# Patient Record
Sex: Female | Born: 1950 | Race: White | Hispanic: No | Marital: Married | Smoking: Never smoker
Health system: Southern US, Academic
[De-identification: ages and names within clinical notes are randomized; demographics above are authoritative.]

## PROBLEM LIST (undated history)

## (undated) DIAGNOSIS — G4730 Sleep apnea, unspecified: Secondary | ICD-10-CM

## (undated) DIAGNOSIS — I4891 Unspecified atrial fibrillation: Secondary | ICD-10-CM

## (undated) DIAGNOSIS — K219 Gastro-esophageal reflux disease without esophagitis: Secondary | ICD-10-CM

## (undated) HISTORY — DX: Unspecified atrial fibrillation (CMS HCC): I48.91

## (undated) HISTORY — PX: ESOPHAGOGASTRODUODENOSCOPY: SHX1529

## (undated) HISTORY — DX: Gastro-esophageal reflux disease without esophagitis: K21.9

## (undated) HISTORY — DX: Sleep apnea, unspecified: G47.30

## (undated) HISTORY — PX: SKIN SURGERY: SHX2413

---

## 2006-11-30 DIAGNOSIS — M1990 Unspecified osteoarthritis, unspecified site: Secondary | ICD-10-CM | POA: Insufficient documentation

## 2007-03-31 DIAGNOSIS — K219 Gastro-esophageal reflux disease without esophagitis: Secondary | ICD-10-CM | POA: Insufficient documentation

## 2008-11-21 DIAGNOSIS — K5730 Diverticulosis of large intestine without perforation or abscess without bleeding: Secondary | ICD-10-CM | POA: Insufficient documentation

## 2009-08-15 DIAGNOSIS — Z0000 Encounter for general adult medical examination without abnormal findings: Secondary | ICD-10-CM | POA: Insufficient documentation

## 2013-11-30 DIAGNOSIS — E785 Hyperlipidemia, unspecified: Secondary | ICD-10-CM | POA: Insufficient documentation

## 2014-06-28 ENCOUNTER — Other Ambulatory Visit (HOSPITAL_COMMUNITY): Payer: Self-pay | Admitting: Surgery

## 2014-07-26 DIAGNOSIS — C4362 Malignant melanoma of left upper limb, including shoulder: Secondary | ICD-10-CM | POA: Insufficient documentation

## 2016-09-22 DIAGNOSIS — I4891 Unspecified atrial fibrillation: Secondary | ICD-10-CM | POA: Insufficient documentation

## 2017-12-18 DIAGNOSIS — E663 Overweight: Secondary | ICD-10-CM | POA: Insufficient documentation

## 2018-07-16 DIAGNOSIS — I959 Hypotension, unspecified: Secondary | ICD-10-CM | POA: Insufficient documentation

## 2018-07-16 DIAGNOSIS — G4739 Other sleep apnea: Secondary | ICD-10-CM | POA: Insufficient documentation

## 2021-07-07 ENCOUNTER — Ambulatory Visit (INDEPENDENT_AMBULATORY_CARE_PROVIDER_SITE_OTHER): Payer: Medicare Other | Admitting: Family

## 2021-07-07 ENCOUNTER — Encounter (INDEPENDENT_AMBULATORY_CARE_PROVIDER_SITE_OTHER): Payer: Self-pay | Admitting: Family

## 2021-07-07 ENCOUNTER — Other Ambulatory Visit: Payer: Self-pay

## 2021-07-07 VITALS — BP 120/67 | HR 67 | Resp 16 | Ht 61.0 in | Wt 148.2 lb

## 2021-07-07 DIAGNOSIS — R3129 Other microscopic hematuria: Secondary | ICD-10-CM

## 2021-07-07 DIAGNOSIS — R319 Hematuria, unspecified: Secondary | ICD-10-CM | POA: Insufficient documentation

## 2021-07-07 LAB — POCT URINE DIPSTICK
BILIRUBIN: NEGATIVE
BLOOD: NEGATIVE
GLUCOSE: NEGATIVE
KETONE: NEGATIVE
LEUKOCYTES: NEGATIVE
NITRITE: NEGATIVE
PH: 7
PROTEIN: NEGATIVE
SPECIFIC GRAVITY: 1.01
UROBILINOGEN: 0.2

## 2021-07-07 NOTE — Progress Notes (Signed)
UROLOGY, [facility]  [address]  [place] [address]      History and Physical Notes     Name: Lindsay Flores MRN:  [id number]   Date: 07/07/2021 Age/DOB:71 y.o. Jan 18, 1951        Name: Lindsay Flores                       Date of Birth: 08/28/1950   MRN:  [id number]                         Date of visit: 07/07/2021     PCP: Hipolito Bayley, FNP   Referring Provider: No referring provider defined for this encounter.     HPI:  Lindsay Flores is a 71 y.o. female who presents with Blood in urine (New patient ref for hematuria. Patient states she does not visually see blood.  Patient states she set down on the bed and felt like something had pushed up in her. She states the only thing different she did was added more green tea to her diet.)   to clinic.      UA today negative. She is on Eliquis and ASA. She doesn't smoke and has no family history of bladder cancers.    Past Medical History  Current Outpatient Medications   Medication Sig   . apixaban (ELIQUIS) 5 mg Oral Tablet Take 1 Tablet (5 mg total) by mouth Twice daily   . aspirin (ECOTRIN) 81 mg Oral Tablet, Delayed Release (E.C.) Take 1 Tablet (81 mg total) by mouth Once a day   . calcium carbonate-vitamin D3 500 mg-5 mcg (200 unit) Oral Tablet Take 1 Tablet by mouth Once a day   . coenzyme Q10 30 mg Oral Capsule Take 1 Capsule (30 mg total) by mouth Every 7 days Take twice a week   . famotidine (PEPCID) 40 mg Oral Tablet Take 1 Tablet (40 mg total) by mouth Once a day   . Magnesium 250 mg Oral Tablet Take 1 Tablet (250 mg total) by mouth Once a day   . multivitamin with minerals (DAILY MULTIVITAMIN-MINERALS) Oral Tablet Take 1 Tablet by mouth Once a day   . pantoprazole (PROTONIX) 40 mg Oral Tablet, Delayed Release (E.C.) Take 1 Tablet (40 mg total) by mouth Once a day   . rosuvastatin (CRESTOR) 20 mg Oral Tablet Take 1 Tablet (20 mg total) by mouth Once a day     No Known Allergies  Past Medical History:   Diagnosis Date   . A-fib (CMS  HCC)    . Esophageal reflux    . Sleep apnea          Past Surgical History:   Procedure Laterality Date   . ESOPHAGOGASTRODUODENOSCOPY     . HX CESAREAN SECTION      x2   . 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 Marital status: Married   Tobacco Use   . Smoking status: Never   . Smokeless tobacco: Never   Substance and Sexual Activity   . Alcohol use: Never   . Drug use: Never     Social  Determinants of Health     Financial Resource Strain: Low Risk    . SDOH Financial: No   Transportation Needs: Low Risk    . SDOH Transportation: No   Social Connections: Low Risk    . SDOH Social Isolation: 5 or more times a week   Intimate Partner Violence: Low Risk    . SDOH Domestic Violence: No   Housing Stability: Low Risk    . SDOH Housing Situation: I have housing.        Patient Active Problem List    Diagnosis Date Noted   . Hematuria 07/07/2021        REVIEW OF SYSTEMS:   As per HPI.     Physical Exam  Constitutional:       General: She is not in acute distress.     Appearance: Normal appearance.   Pulmonary:      Effort: Pulmonary effort is normal. No respiratory distress.   Neurological:      Mental Status: She is alert.   Psychiatric:         Mood and Affect: Mood normal.         Behavior: Behavior normal.       BP 120/67   Pulse 67   Resp 16   Ht 1.549 m ('5\' 1"'$ )   Wt 67.2 kg (148 lb 3.2 oz)   SpO2 98%   BMI 28.00 kg/m         Assessment/Plan  Assessment/Plan   1. Other microscopic hematuria      Orders Placed This Encounter   . POCT Urine Dipstick        Renal U/S and follow-up after. She would like this done at the end of June.    Vora Clover, FNP-C

## 2021-09-10 ENCOUNTER — Other Ambulatory Visit: Payer: Self-pay

## 2021-09-10 ENCOUNTER — Inpatient Hospital Stay
Admission: RE | Admit: 2021-09-10 | Discharge: 2021-09-10 | Disposition: A | Discharge status: Home / Self Care | Payer: Medicare Other | Source: Ambulatory Visit | Attending: Family | Admitting: Family

## 2021-09-10 DIAGNOSIS — R3129 Other microscopic hematuria: Secondary | ICD-10-CM | POA: Insufficient documentation

## 2021-09-16 ENCOUNTER — Other Ambulatory Visit: Payer: Self-pay

## 2021-09-16 ENCOUNTER — Other Ambulatory Visit: Payer: Medicare Other | Attending: Family | Admitting: Family

## 2021-09-16 ENCOUNTER — Ambulatory Visit (INDEPENDENT_AMBULATORY_CARE_PROVIDER_SITE_OTHER): Payer: Medicare Other | Admitting: Family

## 2021-09-16 ENCOUNTER — Encounter (INDEPENDENT_AMBULATORY_CARE_PROVIDER_SITE_OTHER): Payer: Self-pay | Admitting: Family

## 2021-09-16 VITALS — BP 108/68 | HR 66 | Resp 16 | Ht 61.0 in | Wt 145.0 lb

## 2021-09-16 DIAGNOSIS — R3129 Other microscopic hematuria: Secondary | ICD-10-CM | POA: Insufficient documentation

## 2021-09-16 LAB — POCT URINE DIPSTICK
BILIRUBIN: NEGATIVE
GLUCOSE: NEGATIVE
KETONE: NEGATIVE
LEUKOCYTES: NEGATIVE
NITRITE: NEGATIVE
PH: 6.5
PROTEIN: NEGATIVE
SPECIFIC GRAVITY: 1.01
UROBILINOGEN: 0.2

## 2021-09-16 LAB — URINALYSIS, MACROSCOPIC
BILIRUBIN: NEGATIVE mg/dL
BLOOD: 0.06 mg/dL — AB
GLUCOSE: NEGATIVE mg/dL
KETONES: NEGATIVE mg/dL
LEUKOCYTES: NEGATIVE WBCs/uL
NITRITE: NEGATIVE
PH: 6.5 (ref 5.0–9.0)
PROTEIN: NEGATIVE mg/dL
SPECIFIC GRAVITY: 1.008 (ref 1.002–1.030)
UROBILINOGEN: NORMAL mg/dL

## 2021-09-16 LAB — URINALYSIS, MICROSCOPIC
RBCS: 3 /hpf (ref <4)
WBCS: <1 /hpf (ref <6)

## 2021-09-16 NOTE — Progress Notes (Signed)
UROLOGY, [facility]  [address]  [place] [address]      Return Patient Note     Name: Lindsay Flores MRN:  [id number]   Date: 09/16/2021 Age/DOB:71 y.o. 1950/08/13        Name: Lindsay Flores                       Date of Birth: [birth year]   MRN:  [id number]                         Date of visit: 09/16/2021     PCP: Hipolito Bayley, FNP   Referring Provider: No referring provider defined for this encounter.     HPI:  [name] is a 71 y.o. female who presents with Blood in urine (Follow up U/S)   to clinic.      UA  07/07/21 in office was negative. She is on Eliquis and ASA. She doesn't smoke and has no family history of bladder cancers. Denies gross hematuria, dysuria and bothersome LUTS.    Renal U/S 09/10/21 showed no significant renal abnormality or upper tract obstruction.    UA 09/16/21 in office showed small blood.    Past Medical History  Current Outpatient Medications   Medication Sig   . apixaban (ELIQUIS) 5 mg Oral Tablet Take 1 Tablet (5 mg total) by mouth Twice daily   . aspirin (ECOTRIN) 81 mg Oral Tablet, Delayed Release (E.C.) Take 1 Tablet (81 mg total) by mouth Once a day   . calcium carbonate-vitamin D3 500 mg-5 mcg (200 unit) Oral Tablet Take 1 Tablet by mouth Once a day   . coenzyme Q10 30 mg Oral Capsule Take 1 Capsule (30 mg total) by mouth Every 7 days Take twice a week   . Magnesium 250 mg Oral Tablet Take 1 Tablet (250 mg total) by mouth Once a day   . multivitamin with minerals (DAILY MULTIVITAMIN-MINERALS) Oral Tablet Take 1 Tablet by mouth Once a day   . rosuvastatin (CRESTOR) 20 mg Oral Tablet Take 1 Tablet (20 mg total) by mouth Once a day     No Known Allergies  Past Medical History:   Diagnosis Date   . A-fib (CMS HCC)    . Esophageal reflux    . Sleep apnea          Past Surgical History:   Procedure Laterality Date   . ESOPHAGOGASTRODUODENOSCOPY     . HX CESAREAN SECTION      x2   . 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 Marital status: Married   Tobacco Use   . Smoking status: Never   . Smokeless tobacco: Never   Substance and Sexual Activity   . Alcohol use: Never   . Drug use: Never     Social Determinants of Health     Financial Resource Strain: Low Risk    . SDOH Financial: No   Transportation Needs: Low Risk    . SDOH Transportation: No   Social Connections: Low Risk    . SDOH Social Isolation: 5 or more times a week  Intimate Partner Violence: Low Risk    . SDOH Domestic Violence: No   Housing Stability: Low Risk    . SDOH Housing Situation: I have housing.        Patient Active Problem List    Diagnosis Date Noted   . Hematuria 07/07/2021        REVIEW OF SYSTEMS:   As per HPI.      Physical Exam  Constitutional:       General: She is not in acute distress.     Appearance: She is not ill-appearing.   Pulmonary:      Effort: Pulmonary effort is normal. No respiratory distress.   Neurological:      Mental Status: She is alert.   Psychiatric:         Mood and Affect: Mood normal.         Behavior: Behavior normal.       BP 108/68   Pulse 66   Resp 16   Ht 1.549 m ('5\' 1"'$ )   Wt 65.8 kg (145 lb)   SpO2 97%   BMI 27.40 kg/m         Assessment/Plan  Assessment/Plan   1. Other microscopic hematuria      Plan-  Renal U/S reviewed. Check microscopic UA today. Further recommendations to follow after UA is reviewed.    Kalyani Maeda, FNP-C

## 2021-09-17 ENCOUNTER — Encounter (INDEPENDENT_AMBULATORY_CARE_PROVIDER_SITE_OTHER): Payer: Self-pay | Admitting: Family

## 2021-09-18 ENCOUNTER — Other Ambulatory Visit (INDEPENDENT_AMBULATORY_CARE_PROVIDER_SITE_OTHER): Payer: Self-pay | Admitting: Family

## 2021-09-18 DIAGNOSIS — R3129 Other microscopic hematuria: Secondary | ICD-10-CM

## 2021-10-27 ENCOUNTER — Ambulatory Visit (HOSPITAL_COMMUNITY): Payer: Self-pay

## 2021-11-11 ENCOUNTER — Encounter (INDEPENDENT_AMBULATORY_CARE_PROVIDER_SITE_OTHER): Payer: Self-pay | Admitting: Student in an Organized Health Care Education/Training Program

## 2021-12-22 ENCOUNTER — Inpatient Hospital Stay
Admission: RE | Admit: 2021-12-22 | Discharge: 2021-12-22 | Disposition: A | Discharge status: Home / Self Care | Payer: Medicare Other | Source: Ambulatory Visit | Attending: Family | Admitting: Family

## 2021-12-22 ENCOUNTER — Other Ambulatory Visit (HOSPITAL_COMMUNITY): Payer: Medicare Other

## 2021-12-22 ENCOUNTER — Other Ambulatory Visit: Payer: Self-pay

## 2021-12-22 DIAGNOSIS — R3129 Other microscopic hematuria: Secondary | ICD-10-CM | POA: Insufficient documentation

## 2021-12-22 LAB — CREATININE WITH EGFR
CREATININE: 0.74 mg/dL (ref 0.60–1.30)
ESTIMATED GFR: 87 mL/min/{1.73_m2} (ref >59)

## 2021-12-22 MED ORDER — IOHEXOL 350 MG IODINE/ML INTRAVENOUS SOLUTION
50.0000 mL | INTRAVENOUS | Status: AC
Start: 2021-12-22 — End: 2021-12-22
  Administered 2021-12-22: 100 mL via INTRAVENOUS

## 2021-12-26 ENCOUNTER — Ambulatory Visit (INDEPENDENT_AMBULATORY_CARE_PROVIDER_SITE_OTHER): Payer: Medicare Other | Admitting: Student in an Organized Health Care Education/Training Program

## 2021-12-26 ENCOUNTER — Other Ambulatory Visit: Payer: Self-pay

## 2021-12-26 ENCOUNTER — Encounter (INDEPENDENT_AMBULATORY_CARE_PROVIDER_SITE_OTHER): Payer: Self-pay | Admitting: Student in an Organized Health Care Education/Training Program

## 2021-12-26 VITALS — BP 114/70 | HR 69 | Ht 61.0 in | Wt 144.2 lb

## 2021-12-26 DIAGNOSIS — N281 Cyst of kidney, acquired: Secondary | ICD-10-CM

## 2021-12-26 DIAGNOSIS — R3129 Other microscopic hematuria: Secondary | ICD-10-CM

## 2021-12-26 DIAGNOSIS — M461 Sacroiliitis, not elsewhere classified: Secondary | ICD-10-CM | POA: Insufficient documentation

## 2021-12-26 DIAGNOSIS — N200 Calculus of kidney: Secondary | ICD-10-CM

## 2021-12-26 DIAGNOSIS — J309 Allergic rhinitis, unspecified: Secondary | ICD-10-CM | POA: Insufficient documentation

## 2021-12-26 NOTE — Progress Notes (Signed)
[facility]    Progress Note    Name: Lindsay Flores MRN:  [id number]   Date: 12/26/2021 Age: 71 y.o.       Chief Complaint: Follow-up After Testing (Ct urogram 12/22/21 )    Subjective:   Lindsay Flores is a pleasant 89yoF with a history of microscopic hematuria who recently underwent a CTU which demonstrated bilateral cubcenitmeter reanal cysts and a right upper pole kidney stone. She dnenies history of gorss hemturia. She denies a personal or family history of urologic malignancy. She denies history of occupational chemical exposure.  She reports history kidney stones in the past. She denies fevers, chills, nausea, vomiting, hematuria, dysuria, flank pain, incontinence, dribbling, hesitancy, suprapubic pain, headaches, vision changes, shortness of breath, chest pain.        Objective :  BP 114/70 (Site: Left, Patient Position: Sitting, Cuff Size: Adult)   Pulse 69   Ht 1.549 m ('5\' 1"'$ )   Wt 65.4 kg (144 lb 3.2 oz)   SpO2 98%   BMI 27.25 kg/m       Gen: NAD, alert  Pulm: unlabored at rest  CV: palpable pulses  Abd: soft, Nt/ND  GU: no suprapubic tenderness, no CVAT    Data reviewed:    Current Outpatient Medications   Medication Sig    apixaban (ELIQUIS) 5 mg Oral Tablet Take 1 Tablet (5 mg total) by mouth Twice daily    aspirin (ECOTRIN) 81 mg Oral Tablet, Delayed Release (E.C.) Take 1 Tablet (81 mg total) by mouth Once a day    calcium carbonate-vitamin D3 500 mg-5 mcg (200 unit) Oral Tablet Take 1 Tablet by mouth Once a day    coenzyme Q10 30 mg Oral Capsule Take 1 Capsule (30 mg total) by mouth Every 7 days Take twice a week    famotidine (PEPCID) 10 mg Oral Tablet Take 1 Tablet (10 mg total) by mouth Twice daily    Magnesium 250 mg Oral Tablet Take 1 Tablet (250 mg total) by mouth Once a day    metoprolol tartrate (LOPRESSOR) 25 mg Oral Tablet TAKE 1 TABLET BY MOUTH EVERY MORNING AND 1 TABLET BEFORE BEDTIME    multivitamin with minerals (DAILY MULTIVITAMIN-MINERALS)  Oral Tablet Take 1 Tablet by mouth Once a day    rosuvastatin (CRESTOR) 20 mg Oral Tablet Take 1 Tablet (20 mg total) by mouth Once a day     Assessment/Plan  Problem List Items Addressed This Visit    None    Microscopic hematuria, negative CTU on 12/22/2021  I discussed the differential diagnosis, pathophysiology and nature of asymptomatic microscopic hematuria with patient.  We discussed the that patient falls into the high risk category for genitourinary malignancy as defined by the 2020 AUA Guidelines on microscopic hematuria and the need for further evaluation including:  Labs:  UA  Evaluation:  CTU  Risks of cystourethroscopy including infection, discomfort, transient hematuria, as well as benefits and alternatives were discussed, if pertinent, with patient in detail who voiced agreement and understanding.        History of nephrolithiasis, recurrent nephrolithiasis  I discussed the differential diagnosis, pathophysiology and nature of urolithiasis and recurrent stone disease.  We also reviewed the recent 2014 American Urological Association guideline on "Medical Management of Kidney Stones" and specifically discussed dietary therapies including:  Increase fluid intake to achieve urine output of at least 2.5 liters daily  Limit sodium intake to no more than 100 mEq (2,300 mg) per  day  Consume 1,000-1,200 mg of dietary calcium per day  Limit oxalate-rich foods (beets, spinach, rhubarb, nuts, chocolate)  Limit non-dairy animal protein  Encouraged incresed fruit and vegetable intake        Right non-obstructing nephrolithiasis, 68m  I discussed the differential diagnosis, pathophysiology and nature of asymptomatic non-obstructing renal stones as well as the natural history  Based on the current best-available data [J Urol. 2015 Apr;193(4):1265-9] with an average follow-up of 3.5 years:  Approximately 60% of patients will remain asymptomatic  Less than 30% will experience renal colic  Less than 285%will require  operative intervention for pain  Spontaneous stone passage will occur in 7% of cases  Importantly, patient was counseled on risk of silent obstruction causing hydronephrosis and requiring intervention    Patient was counseled on the need for possible future treatment for urinary calculi including observation, extracorporeal shockwave lithotripsy (ESWL), ureteroscopic extraction/intracorporeal laser lithotripsy, percutaneous nephrolithotomy (PCNL), or open ureterolithotomy/nephrolithotomy. The risks, advantages, and disadvantages of each were discussed.   I reviewed the recent 2014 American Urological Association guideline on "Medical Management of Kidney Stones" and specifically discussed dietary therapies including:  Increase fluid intake to achieve urine output of at least 2.5 liters daily  Limit sodium intake to no more than 100 mEq (2,300 mg) per day  Consume 1,000-1,200 mg of dietary calcium per day  Limit oxalate-rich foods (beets, spinach, rhubarb, nuts, chocolate)  Limit non-dairy animal protein  Encouraged incresed fruit and vegetable intake  Patient was provided with written stone prevention recommendations.  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  We spent considerable time personally reviewing relevant imaging which are consistent with a simple renal cyst  I discussed the nature of incompletely characterized renal cyst warranting further dedicated imaging for further lesion characterization  I discussed the nature of simple appearing Bosniak I renal cyst which have been reported to affect more than 50% of patients over  the age of 89and warrant no further workup, surveillance or intervention if asymptomatic; however, repeat ultrasonography may be considered at 6 to 12 months in selected patients in order to confirm stability and a correct diagnosis  I discussed the nature of minimally complex Bosniak II renal cyst which pose limited oncologic potential and generally do not warrant further radiographic surveillance; however, repeat ultrasonography may be considered at 6 to 12 months in selected patients in order to confirm stability and a correct diagnosis  I discussed the nature of complex Bosniak IIF renal cyst which has a reported incidence of malignancy ranging from 5-20% and accordingly warrants further radiographic surveillance  I discussed the nature of complex Bosniak III renal cyst which carries nearly a 50% risk of malignancy and warrants surgical extirpation  I discussed the nature of complex Bosniak IV renal cyst which carries nearly a 90% risk of malignancy and warrants surgical extirpation  Based on patient's clinical and radiographic findings, I would recommend no further imaging  Generally the indications for surgical intervention for renal cysts include suspected malignancy, pain attributable to cyst expansion, cyst infection and angiotensin-dependent hypertension.  We also discussed the limited role of percutaneous renal fine-needle aspiration (  FNA) biopsy; unfortunately, biopsy cannot reliably distinguish a benign renal cyst from the less common cystic renal cell carcinoma.  Preoperative needle biopsy has generally not been recommended for resectable renal lesions, because of concern about seeding of the peritoneum.        Landis Gandy, DO     A combined total of 45 minutes were spent preparing to see the patient, reviewing previous records, ordering tests/medications/procedures, documenting the clinical encounter as well as performing a medically appropriate evaluation and independently interpreting results  and communicating them to the patient/family/caregiver as specifically outlined above in the impression and plan.

## 2022-01-05 ENCOUNTER — Other Ambulatory Visit: Payer: Self-pay

## 2022-01-05 ENCOUNTER — Encounter (HOSPITAL_COMMUNITY): Payer: Self-pay | Admitting: Student in an Organized Health Care Education/Training Program

## 2022-01-05 ENCOUNTER — Inpatient Hospital Stay
Admission: RE | Admit: 2022-01-05 | Discharge: 2022-01-05 | Disposition: A | Discharge status: Home / Self Care | Payer: Medicare Other | Source: Ambulatory Visit | Attending: Student in an Organized Health Care Education/Training Program | Admitting: Student in an Organized Health Care Education/Training Program

## 2022-01-05 ENCOUNTER — Encounter (HOSPITAL_COMMUNITY)
Admission: RE | Disposition: A | Discharge status: Home / Self Care | Payer: Self-pay | Source: Ambulatory Visit | Attending: Student in an Organized Health Care Education/Training Program

## 2022-01-05 DIAGNOSIS — Z87442 Personal history of urinary calculi: Secondary | ICD-10-CM

## 2022-01-05 DIAGNOSIS — R3129 Other microscopic hematuria: Secondary | ICD-10-CM

## 2022-01-05 SURGERY — CYSTOSCOPY
Anesthesia: Local (Nurse-Monitored) | Wound class: Clean Contaminated Wounds-The respiratory, GI, Genital, or urinary

## 2022-01-05 MED ORDER — LIDOCAINE 2 % MUCOSAL JELLY IN APPLICATOR
Status: AC
Start: 2022-01-05 — End: 2022-01-05
  Filled 2022-01-05: qty 10

## 2022-01-05 MED ORDER — LIDOCAINE 2 % MUCOSAL JELLY IN APPLICATOR
Freq: Once | Status: DC | PRN
Start: 2022-01-05 — End: 2022-01-05
  Administered 2022-01-05: 6 mL via URETHRAL

## 2022-01-05 NOTE — OR Surgeon (Signed)
[facility]                                              OPERATIVE NOTE    Patient Name: Holcomb, [facility] Number: [id number]  Date of Service: 01/05/2022   Date of Birth: May 30, 1950    All elements must be documented.    Pre-Operative Diagnosis:microscopic hematuria   Post-Operative Diagnosis:same  Procedure(s)/Description:flexible cystoscopy  Findings:  Normal-appearing bladder urothelium    Patient prepped and draped in sterile fashion.  A well lubricated flexible cystoscope was inserted into the urethra.  Systematic evaluation of the bladder did not reveal any tumors polyps diverticula or trabeculations.  Bilateral ureteral orifices in orthotopic position.      Attending Surgeon: Landis Gandy  Assistant(s): NA    Anesthesia Type: Local (Nurse-Monitored)  Estimated Blood Loss:  Minimal  Blood Given: NA  Fluids Given: NS  Complications (unintended/unexpected/iatrogenic/accidental/inadvertent events):  NA  Characteristic Event (routinely expected or inherent to the difficulty/nature of the procedure): NA  Did the use of current and/or prior Anticoagulants impact the outcome of the case? No  Wound Class: Clean Contaminated Wounds -Respiratory, GI, Genital, or Urinary    Tubes: None  Drains: None  Specimens/ Cultures: NA  Implants: NA           Disposition: PACU - hemodynamically stable.  Condition: stable    Plan:  F/u in one year      Landis Gandy, DO

## 2022-01-05 NOTE — H&P (Signed)
[facility]  Urology Admission History and Physical      [place], Lindsay Flores, 71 y.o. female  Encounter Start Date:  (Not on file)  Inpatient Admission Date:    Date of Birth:  Nov 23, 1950    PCP: Hipolito Bayley, FNP    Information Obtained from: patient  Chief Complaint: hematuria       HPI:    Lindsay Flores is a pleasant 71yoF with a history of microscopic hematuria who recently underwent a CTU which demonstrated bilateral cubcenitmeter reanal cysts and a right upper pole kidney stone. She dnenies history of gorss hemturia. She denies a personal or family history of urologic malignancy. She denies history of occupational chemical exposure.  She reports history kidney stones in the past. She denies fevers, chills, nausea, vomiting, hematuria, dysuria, flank pain, incontinence, dribbling, hesitancy, suprapubic pain, headaches, vision changes, shortness of breath, chest pain.        ROS:  MUST comment on all "Abnormal" findings   ROS Other than ROS in the HPI, all other systems were negative.      PAST MEDICAL/ FAMILY/ SOCIAL HISTORY:       Past Medical History:   Diagnosis Date    A-fib (CMS HCC)     Esophageal reflux     Sleep apnea          No Known Allergies   Cannot display prior to admission medications because the patient has not been admitted in this contact.           No current facility-administered medications for this encounter.    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      Microscopic hematuria, negative  CTU on 12/22/2021  I discussed the differential diagnosis, pathophysiology and nature of asymptomatic microscopic hematuria with patient.  We discussed the that patient falls into the high risk category for genitourinary malignancy as defined by the 2020 AUA Guidelines on microscopic hematuria and the need for further evaluation including:  Labs:  UA  Evaluation:  CTU  Risks of cystourethroscopy including infection, discomfort, transient hematuria, as well as benefits and alternatives were discussed, if pertinent, with patient in detail who voiced agreement and understanding.           History of nephrolithiasis, recurrent nephrolithiasis  I discussed the differential diagnosis, pathophysiology and nature of urolithiasis and recurrent stone disease.  We also reviewed the recent 2014 American Urological Association guideline on "Medical Management of Kidney Stones" and specifically discussed dietary therapies including:  Increase fluid intake to achieve urine output of at least 2.5 liters daily  Limit sodium intake to no more than 100 mEq (2,300 mg) per day  Consume 1,000-1,200 mg of dietary calcium per day  Limit oxalate-rich foods (beets, spinach, rhubarb,  nuts, chocolate)  Limit non-dairy animal protein  Encouraged incresed fruit and vegetable intake           Right non-obstructing nephrolithiasis, 75m  I discussed the differential diagnosis, pathophysiology and nature of asymptomatic non-obstructing renal stones as well as the natural history  Based on the current best-available data [J Urol. 2015 Apr;193(4):1265-9] with an average follow-up of 3.5 years:  Approximately 60% of patients will remain asymptomatic  Less than 30% will experience renal colic  Less than 219%will require operative intervention for pain  Spontaneous stone passage will occur in 7% of cases  Importantly, patient was counseled on risk of silent obstruction causing hydronephrosis and requiring intervention    Patient was counseled on the need  for possible future treatment for urinary calculi including observation, extracorporeal shockwave lithotripsy (ESWL), ureteroscopic extraction/intracorporeal laser lithotripsy, percutaneous nephrolithotomy (PCNL), or open ureterolithotomy/nephrolithotomy. The risks, advantages, and disadvantages of each were discussed.   I reviewed the recent 2014 American Urological Association guideline on "Medical Management of Kidney Stones" and specifically discussed dietary therapies including:  Increase fluid intake to achieve urine output of at least 2.5 liters daily  Limit sodium intake to no more than 100 mEq (2,300 mg) per day  Consume 1,000-1,200 mg of dietary calcium per day  Limit oxalate-rich foods (beets, spinach, rhubarb, nuts, chocolate)  Limit non-dairy animal protein  Encouraged incresed fruit and vegetable intake  Patient was provided with written stone prevention recommendations.  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  We spent considerable time personally reviewing relevant imaging which are consistent with a simple renal cyst  I discussed the nature of incompletely characterized renal cyst warranting further dedicated imaging for further lesion characterization  I discussed the nature of simple appearing Bosniak I renal cyst which have been reported to affect more than 50% of patients over the age of 70and warrant no further workup, surveillance or intervention if asymptomatic; however, repeat ultrasonography may be considered at 6 to 12 months in selected patients in order to confirm stability and a correct diagnosis  I  discussed the nature of minimally complex Bosniak II renal cyst which pose limited oncologic potential and generally do not warrant further radiographic surveillance; however, repeat ultrasonography may be considered at 6 to 12 months in selected patients in order to confirm stability and a correct diagnosis  I discussed the nature of complex Bosniak IIF renal cyst which has a reported incidence of malignancy ranging from 5-20% and accordingly warrants further radiographic surveillance  I discussed the nature of complex Bosniak III renal cyst which carries nearly a 50% risk of malignancy and warrants surgical extirpation  I discussed the nature of complex Bosniak IV renal cyst which carries nearly a 90% risk of malignancy and warrants surgical extirpation  Based on patient's clinical and radiographic findings, I would recommend no further imaging  Generally the indications for surgical intervention for renal cysts include suspected malignancy, pain attributable to cyst expansion, cyst infection and angiotensin-dependent hypertension.  We also discussed the limited role of percutaneous renal fine-needle aspiration (FNA) biopsy; unfortunately, biopsy cannot reliably distinguish a benign renal cyst from  the less common cystic renal cell carcinoma.  Preoperative needle biopsy has generally not been recommended for resectable renal lesions, because of concern about seeding of the peritoneum.      Lindsay Gandy, DO

## 2022-01-05 NOTE — Discharge Instructions (Signed)
SURGICAL DISCHARGE INSTRUCTIONS     Dr. Landis Gandy, DO  performed your FLEXIBLE CYSTOSCOPY today at the [facility]:  Monday through Friday from 8 a.m. - 4 p.m.: [phone]    For T&D: [phone]  Between 4 p.m. - 8 a.m., weekends and holidays:  Call ER [phone]    PLEASE SEE WRITTEN HANDOUTS AS DISCUSSED BY YOUR NURSE:  Colbert Curenton, RN    SIGNS AND SYMPTOMS OF A WOUND / INCISION INFECTION   Be sure to watch for the following:  Increase in redness or red streaks near or around the wound or incision.  Increase in pain that is intense or severe and cannot be relieved by the pain medication that your doctor has given you.  Increase in swelling that cannot be relieved by elevation of a body part, or by applying ice, if permitted.  Increase in drainage, or if yellow / green in color and smells bad. This could be on a dressing or a cast.  Increase in fever for longer than 24 hours, or an increase that is higher than 101 degrees Fahrenheit (normal body temperature is 98 degrees Fahrenheit). The incision may feel warm to the touch.    **CALL YOUR DOCTOR IF ONE OR MORE OF THESE SIGNS / SYMPTOMS SHOULD OCCUR.    ANESTHESIA INFORMATION   LOCAL ANESTHETIC:  You have receieved a local anesthetic, the effects should disappear in a few hours.    REMEMBER   If you experience any difficulty breathing, chest pain, bleeding that you feel is excessive, persistent nausea or vomiting or for any other concerns:  Call your physician Dr. Landis Gandy, DO at [phone] . You may also ask to have the general doctor on call paged. They are available to you 24 hours a day.      SPECIAL INSTRUCTIONS / COMMENTS   Rest today and drink plenty of fluids. Some blood in the urine is normal, if it becomes worse come to the Emergency Room.    FOLLOW-UP APPOINTMENTS   Follow-up with Dr. Despina Pole in 1 year.

## 2022-01-19 ENCOUNTER — Other Ambulatory Visit: Payer: Self-pay

## 2022-04-30 ENCOUNTER — Other Ambulatory Visit
Payer: Medicare Other | Attending: Student in an Organized Health Care Education/Training Program | Admitting: Student in an Organized Health Care Education/Training Program

## 2022-04-30 DIAGNOSIS — C519 Malignant neoplasm of vulva, unspecified: Secondary | ICD-10-CM | POA: Insufficient documentation

## 2022-04-30 DIAGNOSIS — N909 Noninflammatory disorder of vulva and perineum, unspecified: Secondary | ICD-10-CM

## 2022-05-04 DIAGNOSIS — C519 Malignant neoplasm of vulva, unspecified: Secondary | ICD-10-CM

## 2022-05-04 LAB — SURGICAL PATHOLOGY SPECIMEN: Clinical History: ABNORMAL

## 2022-05-29 IMAGING — MR MRI CERVICAL SPINE WITHOUT CONTRAST
4 of 5 series · 23 of 48 positions shown · IV contrast (gadolinium)
Comparison: Radiographs dated 04/22/2022.

﻿EXAM:  [id number]   MRI CERVICAL SPINE WITHOUT CONTRAST
INDICATION: Neck pain. Left shoulder pain.  No prior C-spine surgery.
TECHNIQUE: Multiplanar, multisequential MRI of the C-spine was performed without gadolinium contrast.

[Series 5: T2 · sagittal · 3.0mm · 0.75mm/px · 8 of 13 slices shown (1 of 2)]
[im 1/13]
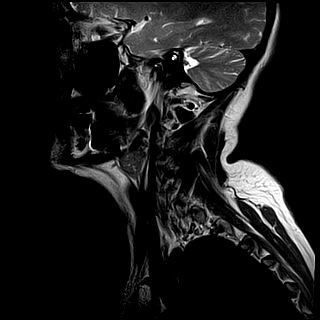
[im 2/13]
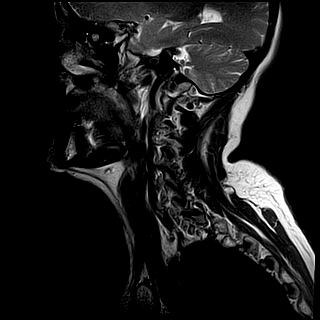
[im 4/13]
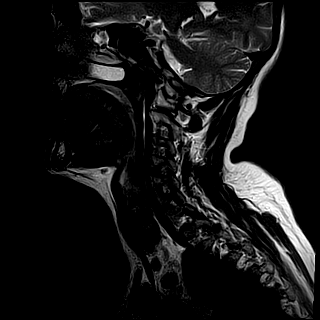
[im 6/13]
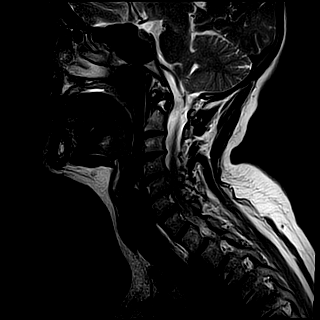
[im 7/13]
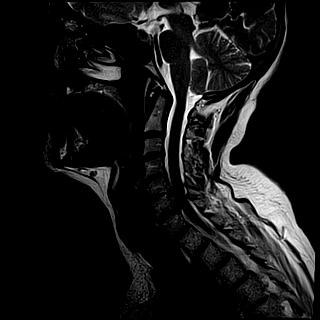
[im 9/13]
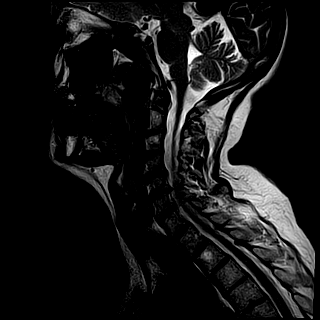
[im 11/13]
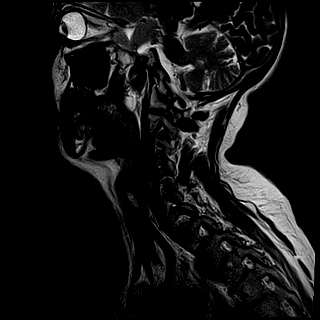
[im 13/13]
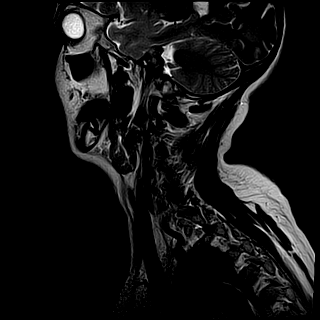

[Series 6: T1 · sagittal · 3.0mm · 0.47mm/px · 3 of 13 slices shown]
[im 2/13]
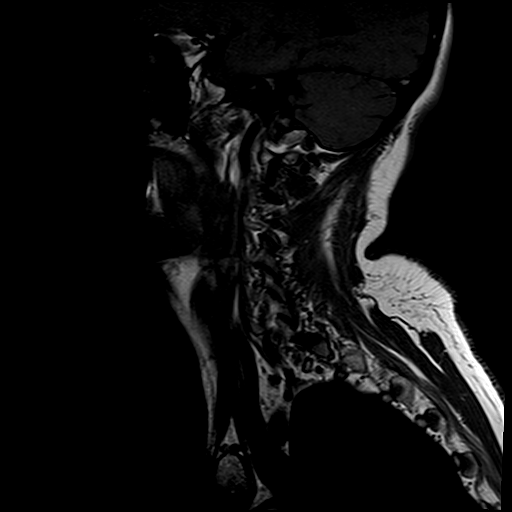
[im 7/13]
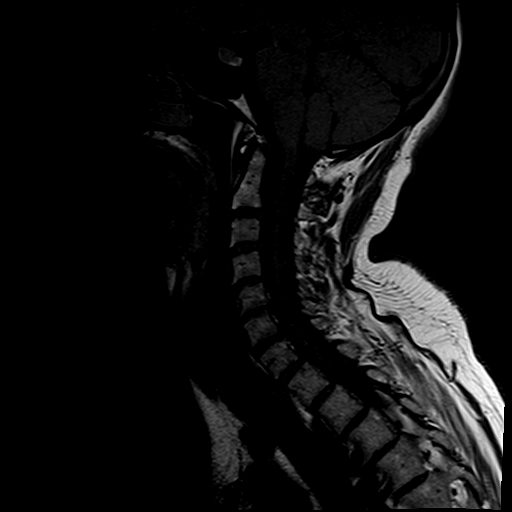
[im 11/13]
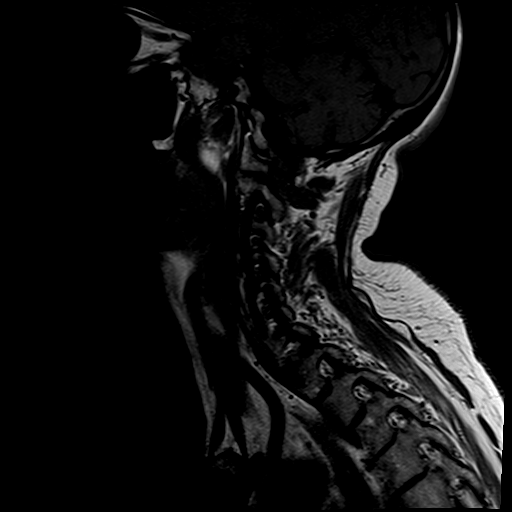

[Series 7: STIR · sagittal · 3.0mm · 0.47mm/px · 3 of 13 slices shown]
[im 2/13]
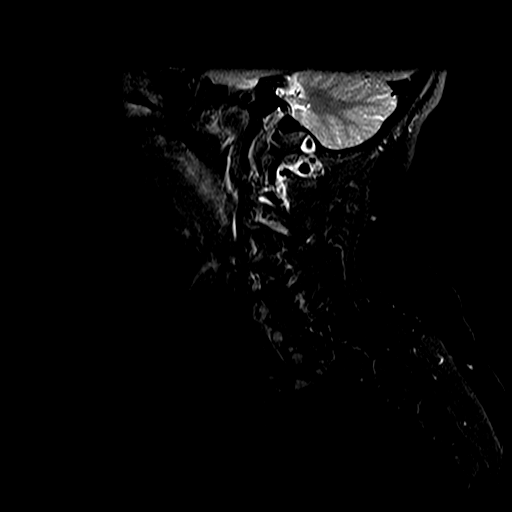
[im 7/13]
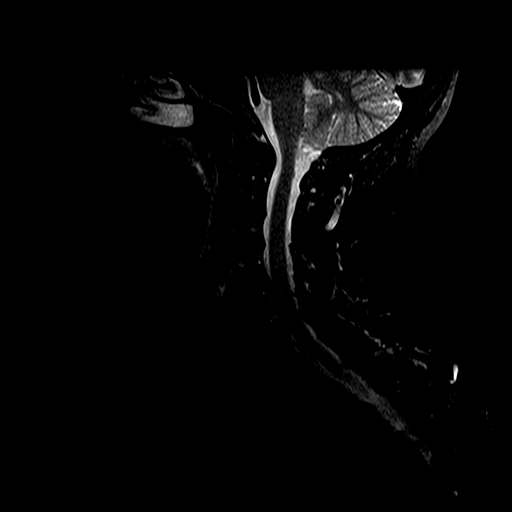
[im 11/13]
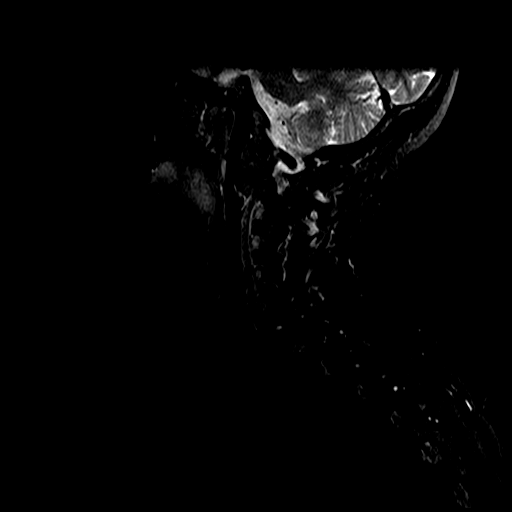

[Series 8: T2 · oblique · 3.0mm · 0.39mm/px · 9 of 18 slices shown (2 of 2)]
[im 1/18]
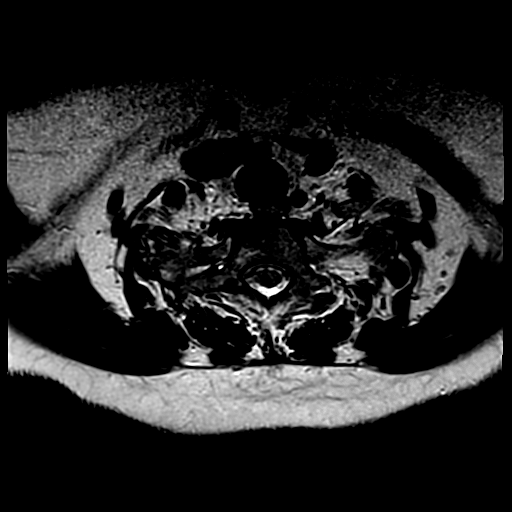
[im 4/18]
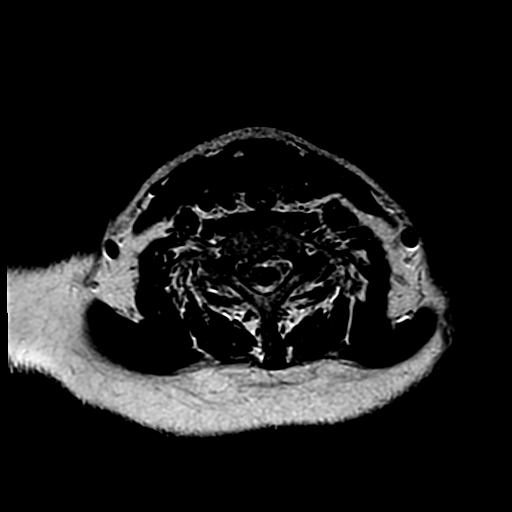
[im 5/18]
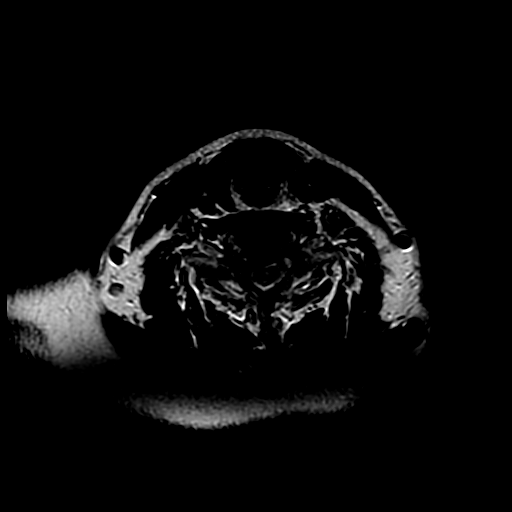
[im 8/18]
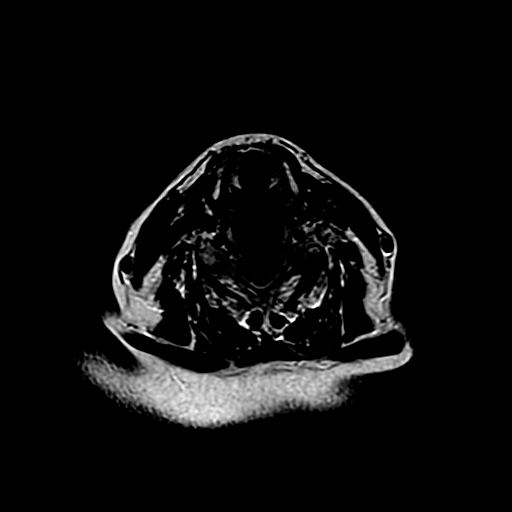
[im 10/18]
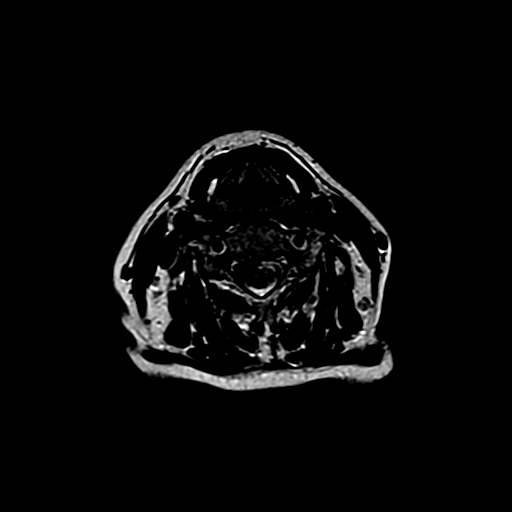
[im 13/18]
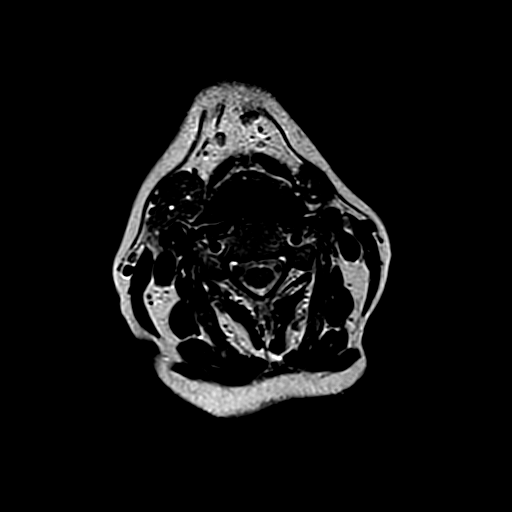
[im 14/18]
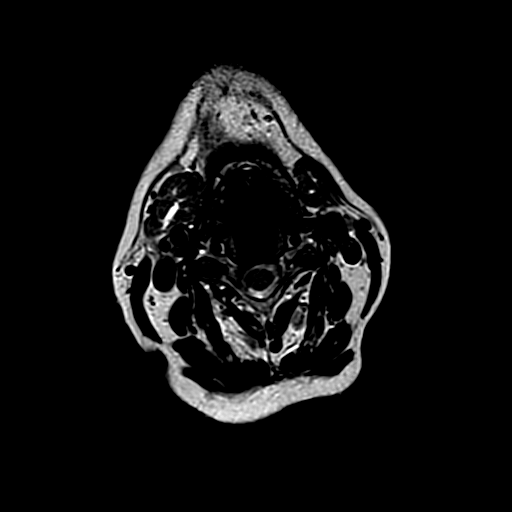
[im 16/18]
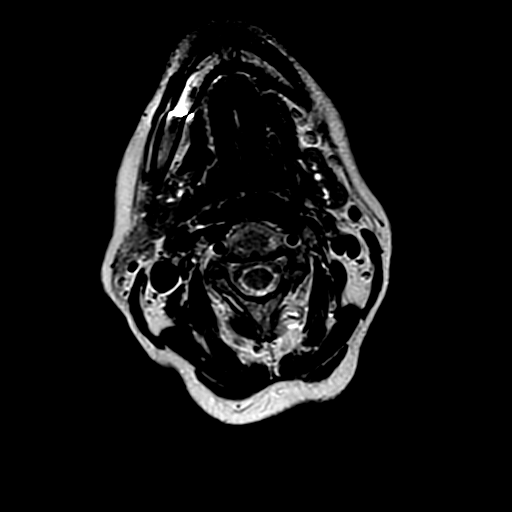
[im 18/18]
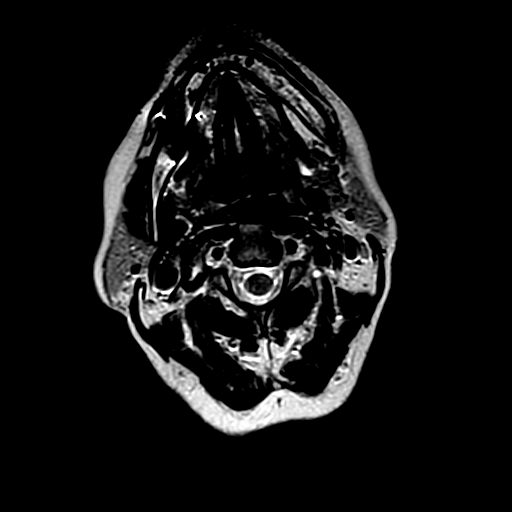

[23 of 48 positions shown; findings below may reference images not displayed]

FINDINGS: No focal bone changes of cervical vertebrae are seen.

Posterior fossa and foramen magnum structures are normal in the sagittal projection.

At C2-3 level, no focal disc lesions are seen.

At C3-4 level, no focal disc lesions are seen. 

At C4-5 level, degenerative disc changes with osteophyte complex is causing moderately significant biforaminal narrowing, right more than the left.

At C5-6 level, significant degenerative disc disease is noted with prominent central bulge, 7 mm in width mildly impinging on thecal sac in the midline.  Asymmetric bulging annulus and osteophyte complex are causing biforaminal stenosis at this level, right worse than the left.  AP diameter of thecal sac in the midline measures 8 mm. 

At C6-7 level, degenerative disc disease with focal bulge to the right of the midline, 12 mm in width impinging on thecal sac and cervical spinal cord to the right of the midline.  Bulging annulus is mildly impinging on both neural foramina.

C7-T1 disc shows no focal lesions. 

No focal cord edema or syrinx cavity are noted.
IMPRESSION: 1.  No acute bone changes of cervical vertebrae are seen.

2.  At C4-5 level, degenerative disc changes with osteophyte complex is causing moderately significant biforaminal narrowing, right more than the left.

3. At C5-6 level, significant degenerative disc disease is noted with prominent central bulge, 7 mm in width mildly impinging on thecal sac in the midline.  Asymmetric bulging annulus and osteophyte complex are causing biforaminal stenosis at this level, right worse than the left.  AP diameter of thecal sac in the midline measures 8 mm. 

4. At C6-7 level, degenerative disc disease with focal bulge to the right of the midline, 12 mm in width impinging on thecal sac and cervical spinal cord to the right of the midline.  Bulging annulus is mildly impinging on both neural foramina.

5. No focal cord edema or syrinx cavity is noted.

## 2022-07-03 IMAGING — MR MRI SHOULDER LT W/O CONTRAST
5 of 6 series · 29 of 40 positions shown · IV contrast (gadolinium)
Comparison: Left shoulder x-ray dated 04/22/2022.

﻿EXAM:  [id number]   MRI SHOULDER LT W/O CONTRAST
INDICATION: 71-year-old with persistent left shoulder pain.  Diminished range of motion.  No history of shoulder surgery.
TECHNIQUE: Multiplanar multisequential MRI of the left shoulder was performed without gadolinium contrast.

[Series 6: T1 · coronal · left · 4.0mm · 0.31mm/px · 6 of 22 slices shown]
[im 1/22]
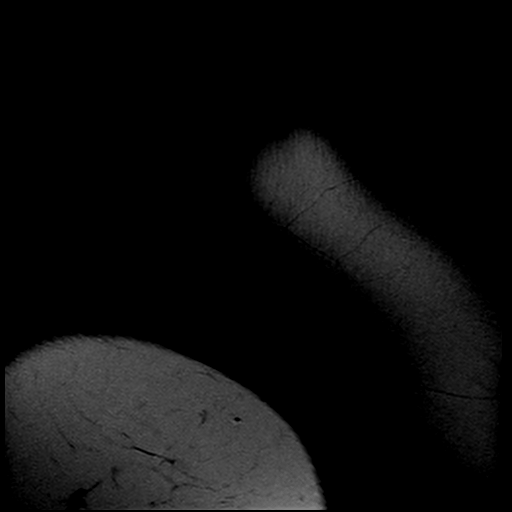
[im 5/22]
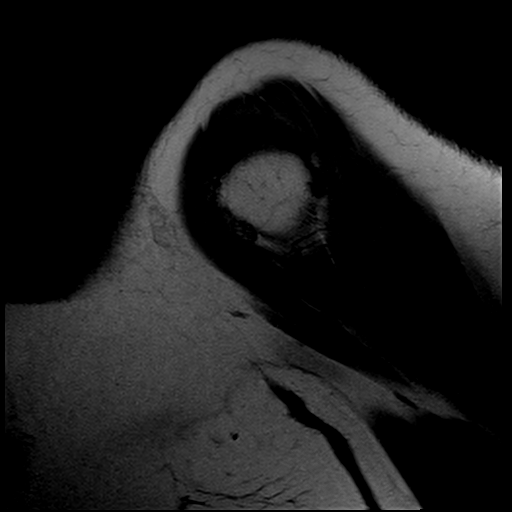
[im 9/22]
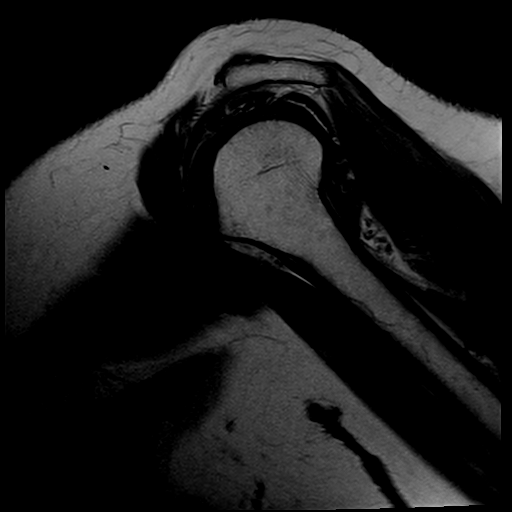
[im 13/22]
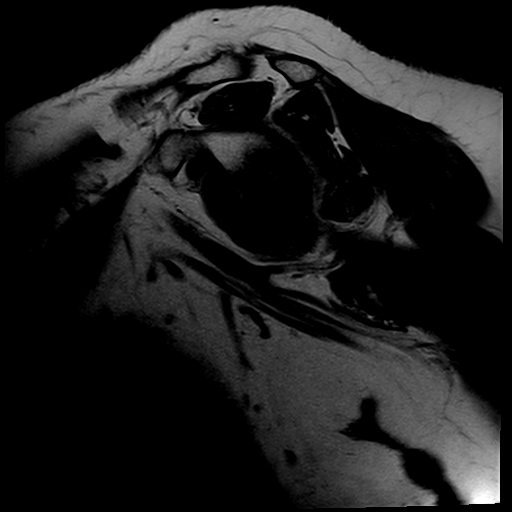
[im 17/22]
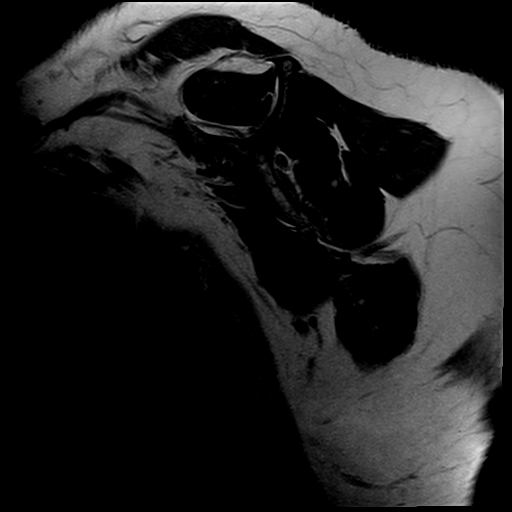
[im 22/22]
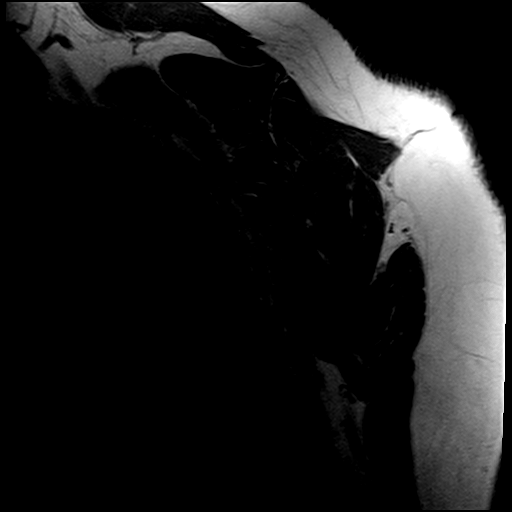

[Series 7: STIR · coronal · left · 4.0mm · 0.36mm/px · 2 of 22 slices shown]
[im 1/22]
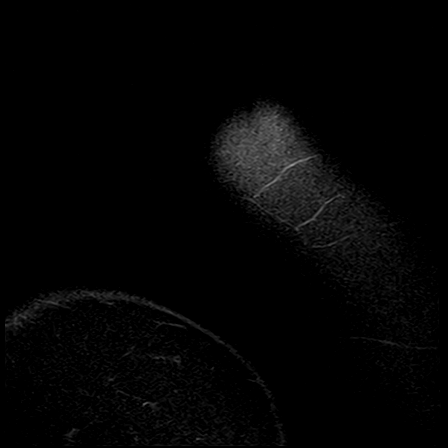
[im 5/22]
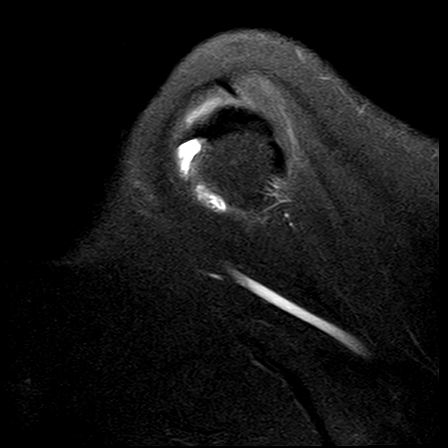

[Series 8: PD fat-sat · axial · left · 4.0mm · 0.36mm/px · z∈[-84,+25]mm · 7 of 24 slices shown (1 of 2)]
[im 1/24]
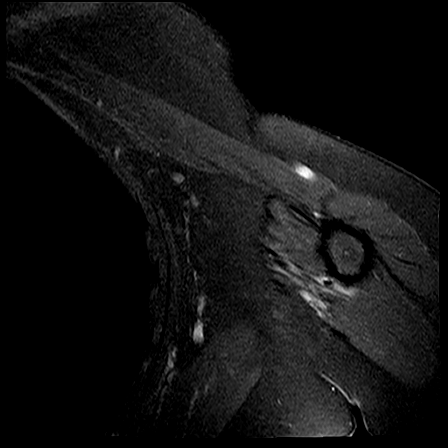
[im 4/24]
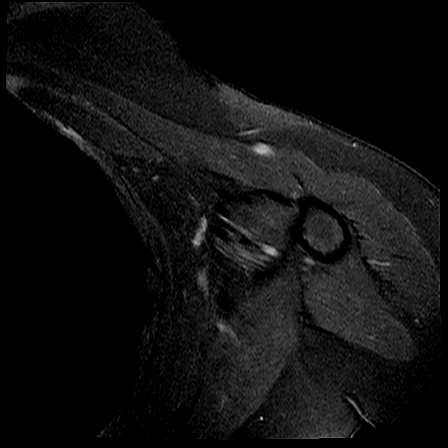
[im 8/24]
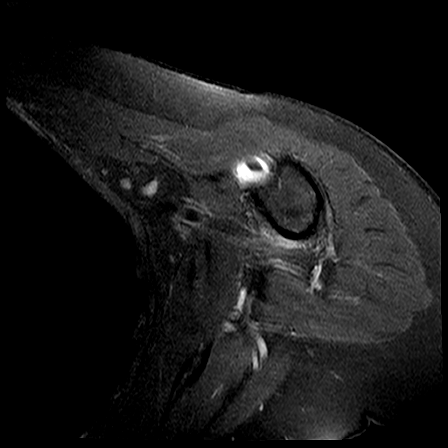
[im 12/24]
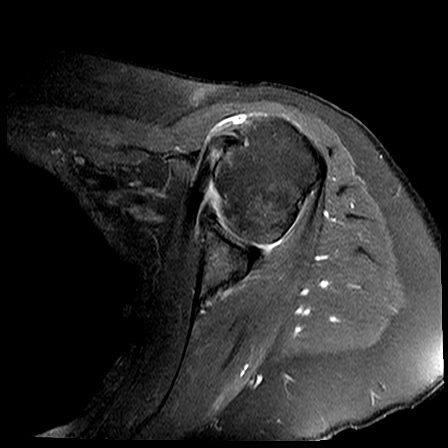
[im 16/24]
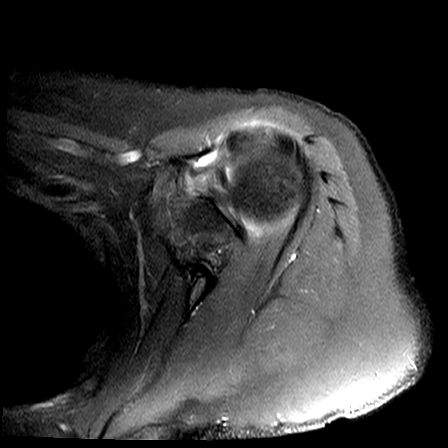
[im 20/24]
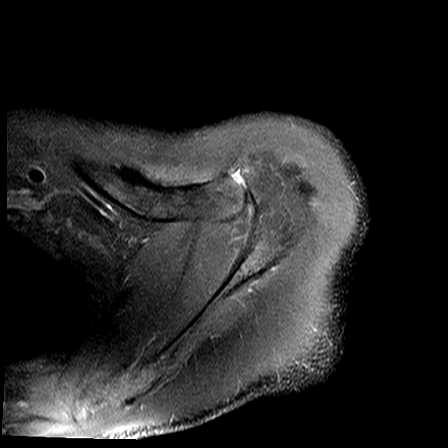
[im 24/24]
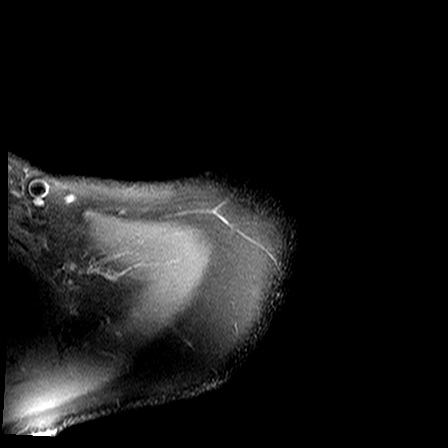

[Series 9: T2 fat-sat · axial · left · 4.0mm · 0.42mm/px · z∈[-84,+25]mm · 7 of 24 slices shown]
[im 1/24]
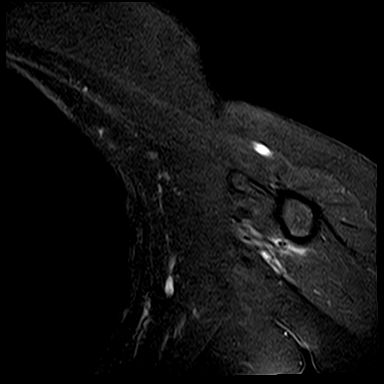
[im 4/24]
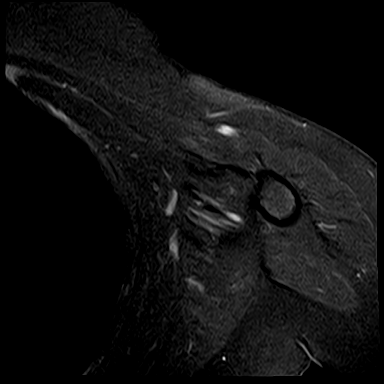
[im 8/24]
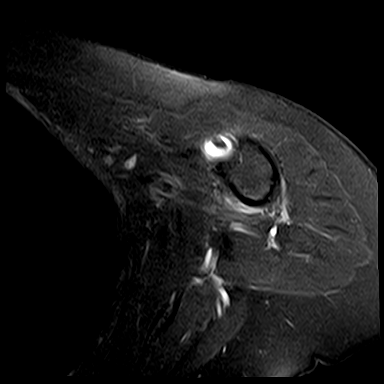
[im 12/24]
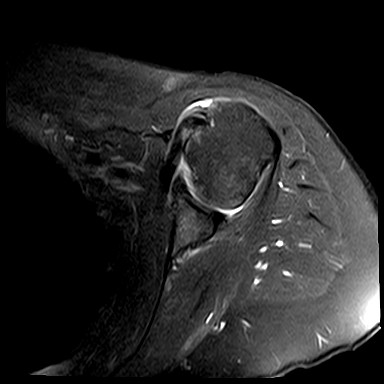
[im 16/24]
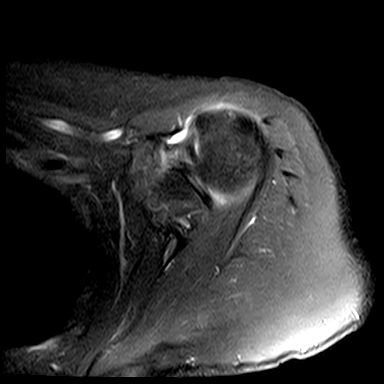
[im 20/24]
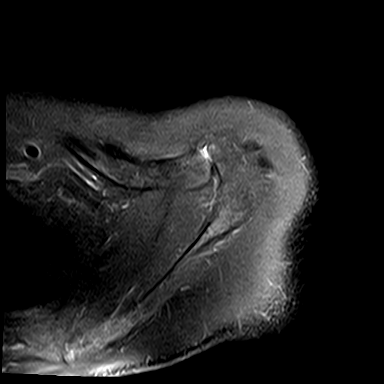
[im 24/24]
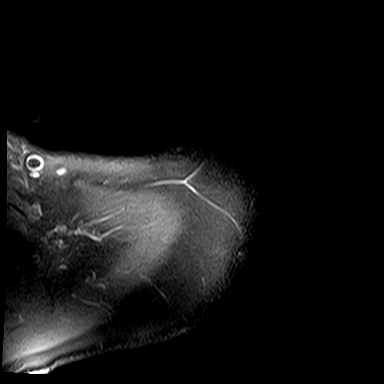

[Series 10: PD fat-sat · oblique · left · 4.0mm · 0.50mm/px · 7 of 23 slices shown (2 of 2)]
[im 1/23]
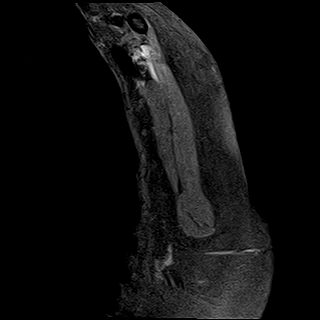
[im 4/23]
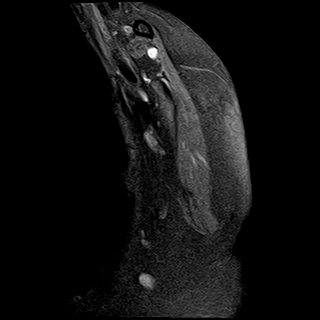
[im 8/23]
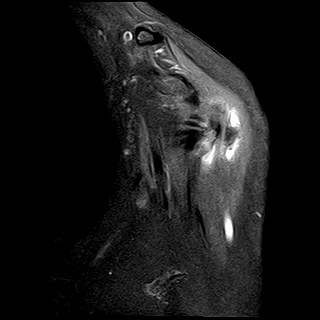
[im 12/23]
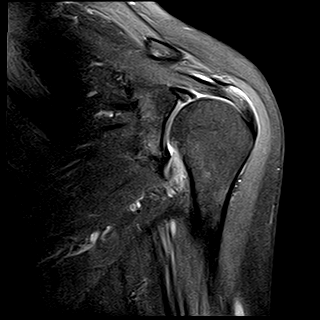
[im 15/23]
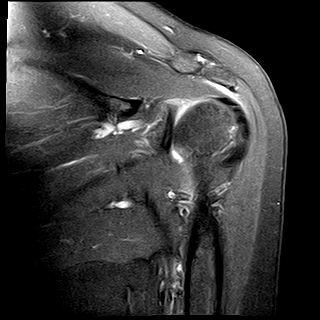
[im 19/23]
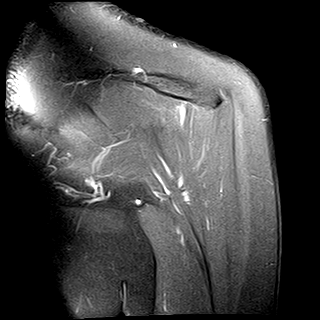
[im 23/23]
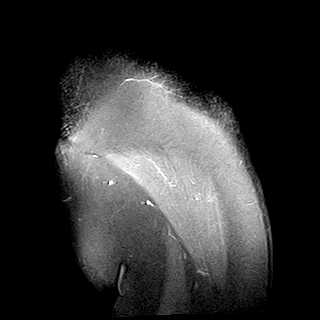

[29 of 40 positions shown; findings below may reference images not displayed]

FINDINGS: No acute fractures are seen at the left shoulder.  A mild focus of bone marrow edema involving lesser tuberosity of the anterior proximal left humerus is noted.  

Tendinitis of the distal subscapularis tendon is noted near the insertion.  Partial thickness tear is noted.  No evidence of full thickness tear of rotator cuff is seen.  

Mild impingement by sloping acromion process in the subacromian space in the supraspinatus.  A small effusion is noted in the subacromian bursa.  No evidence of labral tear.  Long head of biceps tendon is intact.
IMPRESSION: 1. Tendinopathy and partial thickness tear of distal subscapularis tendon near the insertion into proximal humerus.  Focal bone marrow edema of the lesser tuberosity of the proximal humerus.  

2. Sloping acromion process is mildly impinging on the subacromian space.  A small effusion is noted in the subacromian bursa.

## 2023-01-07 ENCOUNTER — Encounter (INDEPENDENT_AMBULATORY_CARE_PROVIDER_SITE_OTHER): Payer: Self-pay | Admitting: Student in an Organized Health Care Education/Training Program

## 2023-01-07 ENCOUNTER — Other Ambulatory Visit: Payer: Self-pay

## 2023-01-07 ENCOUNTER — Ambulatory Visit (INDEPENDENT_AMBULATORY_CARE_PROVIDER_SITE_OTHER): Payer: Medicare Other | Admitting: Student in an Organized Health Care Education/Training Program

## 2023-01-07 ENCOUNTER — Other Ambulatory Visit
Payer: Medicare Other | Attending: Student in an Organized Health Care Education/Training Program | Admitting: Student in an Organized Health Care Education/Training Program

## 2023-01-07 VITALS — BP 117/73 | HR 74 | Ht 61.0 in | Wt 134.0 lb

## 2023-01-07 DIAGNOSIS — N281 Cyst of kidney, acquired: Secondary | ICD-10-CM

## 2023-01-07 DIAGNOSIS — R3129 Other microscopic hematuria: Secondary | ICD-10-CM | POA: Insufficient documentation

## 2023-01-07 DIAGNOSIS — N200 Calculus of kidney: Secondary | ICD-10-CM

## 2023-01-07 LAB — URINALYSIS, MICROSCOPIC: RBCS: 1 /[HPF] (ref <4)

## 2023-01-07 NOTE — Progress Notes (Signed)
UROLOGY, [facility]  [address] [place] [address]    Progress Note    Name: Lindsay Flores MRN:  [id number]   Date: 01/07/2023 DOB:  10-05-1950 (72 y.o.)             Chief Complaint: Follow Up, Kidney Stones, Renal Cyst, and Blood in urine  Subjective   Subjective:   Lindsay Flores is a pleasant 71yoF with a history of microscopic hematuria who recently underwent a CTU which demonstrated bilateral cubcenitmeter reanal cysts and a right upper pole kidney stone. She dnenies history of gorss hemturia. She denies a personal or family history of urologic malignancy. She denies history of occupational chemical exposure.      She had a negative hematuria workup in October of 2023.  She presents today for follow-up.  She reports history kidney stones in the past.  She denies any further episodes of hematuria.  She denies any flank pain.  She denies fevers, chills, nausea, vomiting, hematuria, dysuria, flank pain, incontinence, dribbling, hesitancy, suprapubic pain, headaches, vision changes, shortness of breath, chest pain.          Objective   Objective :  BP 117/73 (Site: Right Arm, Patient Position: Sitting, Cuff Size: Adult)   Pulse 74   Ht 1.549 m (5\' 1" )   Wt 60.8 kg (134 lb)   BMI 25.32 kg/m       Gen: NAD, alert  Pulm: unlabored at rest  CV: palpable pulses  Abd: soft, Nt/ND  GU: no suprapubic tenderness, no CVAT      Data reviewed:    Current Outpatient Medications   Medication Sig    apixaban (ELIQUIS) 5 mg Oral Tablet Take 1 Tablet (5 mg total) by mouth Twice daily    calcium carbonate-vitamin D3 500 mg-5 mcg (200 unit) Oral Tablet Take 1 Tablet by mouth Once a day    coenzyme Q10 30 mg Oral Capsule Take 1 Capsule (30 mg total) by mouth Every 7 days Take twice a week    famotidine (PEPCID) 10 mg Oral Tablet Take 1 Tablet (10 mg total) by mouth Twice daily    Magnesium 250 mg Oral Tablet Take 1 Tablet (250 mg total) by mouth Once a day    metoprolol tartrate (LOPRESSOR) 25 mg Oral  Tablet TAKE 1 TABLET BY MOUTH EVERY MORNING AND 1 TABLET BEFORE BEDTIME    multivitamin with minerals (DAILY MULTIVITAMIN-MINERALS) Oral Tablet Take 1 Tablet by mouth Once a day    rosuvastatin (CRESTOR) 20 mg Oral Tablet Take 1 Tablet (20 mg total) by mouth Once a day        Assessment/Plan  Problem List Items Addressed This Visit    None  Visit Diagnoses       Microscopic hematuria    -  Primary    Relevant Orders    URINALYSIS, MICROSCOPIC (Completed)    Nephrolithiasis        Bilateral renal cysts        Recurrent nephrolithiasis                Microscopic hematuria s/p negative workup in October 2023  I reviewed patient's recent hematuria evaluation and results, including CTU as well as the results of their cystoscopy   Given patient's negative hematuria work-up, I would recommend serial microscopic urinalysis testing for two years in accordance with the 2012 American Urological Association "Asymptomatic Microhematuria" guideline  If microscopic hematuria persists on annual urinalysis, we discussed further diagnostic  options including nephrologic evaluation and/or repeat anatomic evaluation, if clinically indicated          History of nephrolithiasis, recurrent nephrolithiasis  I discussed the differential diagnosis, pathophysiology and nature of urolithiasis and recurrent stone disease.  We also reviewed the recent 2014 American Urological Association guideline on "Medical Management of Kidney Stones" and specifically discussed dietary therapies including:  Increase fluid intake to achieve urine output of at least 2.5 liters daily  Limit sodium intake to no more than 100 mEq (2,300 mg) per day  Consume 1,000-1,200 mg of dietary calcium per day  Limit oxalate-rich foods (beets, spinach, rhubarb, nuts, chocolate)  Limit non-dairy animal protein  Encouraged incresed fruit and vegetable intake           Right non-obstructing nephrolithiasis, 5mm  I discussed the differential diagnosis, pathophysiology and nature  of asymptomatic non-obstructing renal stones as well as the natural history  Based on the current best-available data [J Urol. 2015 Apr;193(4):1265-9] with an average follow-up of 3.5 years:  Approximately 60% of patients will remain asymptomatic  Less than 30% will experience renal colic  Less than 20% will require operative intervention for pain  Spontaneous stone passage will occur in 7% of cases  Importantly, patient was counseled on risk of silent obstruction causing hydronephrosis and requiring intervention    Patient was counseled on the need for possible future treatment for urinary calculi including observation, extracorporeal shockwave lithotripsy (ESWL), ureteroscopic extraction/intracorporeal laser lithotripsy, percutaneous nephrolithotomy (PCNL), or open ureterolithotomy/nephrolithotomy. The risks, advantages, and disadvantages of each were discussed.   I reviewed the recent 2014 American Urological Association guideline on "Medical Management of Kidney Stones" and specifically discussed dietary therapies including:  Increase fluid intake to achieve urine output of at least 2.5 liters daily  Limit sodium intake to no more than 100 mEq (2,300 mg) per day  Consume 1,000-1,200 mg of dietary calcium per day  Limit oxalate-rich foods (beets, spinach, rhubarb, nuts, chocolate)  Limit non-dairy animal protein  Encouraged incresed fruit and vegetable intake  Patient was provided with written stone prevention recommendations.  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  We spent considerable time personally reviewing relevant  imaging which are consistent with a simple renal cyst  I discussed the nature of incompletely characterized renal cyst warranting further dedicated imaging for further lesion characterization  I discussed the nature of simple appearing Bosniak I renal cyst which have been reported to affect more than 50% of patients over the age of 63 and warrant no further workup, surveillance or intervention if asymptomatic; however, repeat ultrasonography may be considered at 6 to 12 months in selected patients in order to confirm stability and a correct diagnosis  I discussed the nature of minimally complex Bosniak II renal cyst which pose limited oncologic potential and generally do not warrant further radiographic surveillance; however, repeat ultrasonography may be considered at 6 to 12 months in selected patients in order to confirm stability and a correct diagnosis  I discussed the nature of complex Bosniak IIF renal cyst which has a reported incidence of malignancy ranging from 5-20% and accordingly warrants further radiographic surveillance  I discussed the nature of complex Bosniak III renal cyst which carries nearly a 50% risk of malignancy and warrants surgical extirpation  I discussed the nature of complex Bosniak IV renal cyst which carries nearly a 90% risk of malignancy and warrants surgical extirpation  Based on patient's clinical and radiographic findings, I would recommend no further imaging  Generally the indications for surgical intervention for renal cysts include suspected malignancy, pain attributable to cyst expansion, cyst infection and angiotensin-dependent hypertension.  We also discussed the limited role of percutaneous renal fine-needle aspiration (FNA) biopsy; unfortunately, biopsy cannot reliably distinguish a benign renal cyst from the less common cystic renal cell carcinoma.  Preoperative needle biopsy has generally not been recommended for resectable renal lesions, because of concern about  seeding of the peritoneum.         Arlana Hove, DO     A combined total of 30 minutes were spent preparing to see the patient, reviewing previous records, ordering tests/medications/procedures, documenting the clinical encounter as well as performing a medically appropriate evaluation and independently interpreting results and communicating them to the patient/family/caregiver as specifically outlined above in the impression and plan.    This note may have been fully or partially generated using MModal Fluency Direct system, and there may be some incorrect words, spellings, and punctuation that were not identified in checking the note before saving.

## 2023-07-19 ENCOUNTER — Ambulatory Visit: Payer: Self-pay | Attending: NURSE PRACTITIONER | Admitting: NURSE PRACTITIONER

## 2023-07-19 ENCOUNTER — Encounter (INDEPENDENT_AMBULATORY_CARE_PROVIDER_SITE_OTHER): Payer: Self-pay | Admitting: NURSE PRACTITIONER

## 2023-07-19 ENCOUNTER — Other Ambulatory Visit: Payer: Self-pay

## 2023-07-19 VITALS — Ht 61.0 in | Wt 136.0 lb

## 2023-07-19 DIAGNOSIS — K118 Other diseases of salivary glands: Secondary | ICD-10-CM | POA: Insufficient documentation

## 2023-07-19 DIAGNOSIS — H9193 Unspecified hearing loss, bilateral: Secondary | ICD-10-CM | POA: Insufficient documentation

## 2023-07-19 NOTE — H&P (Signed)
 ENT, [facility]  [address]  [place] [address]  Phone: [phone]  Fax: [phone]      Encounter Date: 07/19/2023    Patient ID: Lindsay Flores  MRN: [id number]    DOB: [birth year]  Age: 73 y.o. female         Referring Provider:    Blanche Bunker, FNP  [address]  [place],  [place] [address]    Reason for Visit:   Chief Complaint   Patient presents with    Hearing Loss     Patient complains of hearing loss. No recent audio    Throat Symptoms     Patient complains of knot in throat and when she turned her head she would experiencing         History of Present Illness:  Lindsay Flores is a 73 y.o. female referred for hearing loss.  She reports gradual onset of hearing loss AU.  She has hearing aid from the [facility] that are several years old.  Also concerned that she has some pain in the submandibular area bilateral a few weeks ago.  This was triggered by drinking a homemade detox with pineapple juice.  Symptoms now resolved.      Patient History:  Patient Active Problem List   Diagnosis    Hematuria    Sacroiliitis, not elsewhere classified (CMS HCC)    Preventative health care    Overweight with body mass index (BMI) 25.0-29.9    Other sleep apnea    Osteoarthrosis    Malignant melanoma of skin of left upper extremity, including shoulder (CMS HCC)    Hypotension    Hyperlipidemia    Esophageal reflux    Atrial fibrillation with RVR (CMS HCC)    Allergic rhinitis    Diverticulosis of colon     Current Outpatient Medications   Medication Sig    apixaban (ELIQUIS) 5 mg Oral Tablet Take 1 Tablet (5 mg total) by mouth Twice daily    calcium carbonate-vitamin D3 500 mg-5 mcg (200 unit) Oral Tablet Take 1 Tablet by mouth Daily    coenzyme Q10 30 mg Oral Capsule Take 1 Capsule (30 mg total) by mouth Every 7 days Take twice a week    ergocalciferol, vitamin D2, (DRISDOL) 1,250 mcg (50,000 unit) Oral Capsule Take 1 Capsule (50,000 Units total) by mouth Every 7 days    famotidine (PEPCID) 40 mg Oral Tablet Take  1 Tablet (40 mg total) by mouth Daily    Magnesium 250 mg Oral Tablet Take 1 Tablet (250 mg total) by mouth Daily    rosuvastatin (CRESTOR) 20 mg Oral Tablet Take 1 Tablet (20 mg total) by mouth Daily     Allergies   Allergen Reactions    Latex Rash     LATEX GLOVES     Past Medical History:   Diagnosis Date    A-fib     Esophageal reflux     Sleep apnea       Past Surgical History:   Procedure Laterality Date    ESOPHAGOGASTRODUODENOSCOPY      HX CESAREAN SECTION      x2    SKIN SURGERY      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      Family Medical History:       Problem Relation (Age of Onset)    Breast Cancer Mother, Maternal Aunt            Social History     Tobacco Use  Smoking status: Never    Smokeless tobacco: Never   Substance Use Topics    Alcohol use: Never    Drug use: Never       Review of Systems     Vitals:    07/19/23 1602   Weight: 61.7 kg (136 lb)   Height: 1.549 m (5\' 1" )   BMI: 25.7      ENT Physical Exam  Constitutional  Appearance: patient appears well-developed, well-nourished and well-groomed,  Communication/Voice: communication appropriate for developmental age; vocal quality normal;  Head and Face  Appearance: head appears normal, face appears normal and face appears atraumatic;  Palpation: facial palpation normal;  Salivary: glands normal;  Ear  Hearing: intact;  Auricles: right auricle normal; left auricle normal;  External Mastoids: right external mastoid normal; left external mastoid normal;  Ear Canals: right ear canal normal; left ear canal normal;  Tympanic Membranes: right tympanic membrane normal; left tympanic membrane normal;  Nose  External Nose: nares patent bilaterally; external nose normal;  Internal Nose: nasal mucosa normal; septum normal; bilateral inferior turbinates normal;  Oral Cavity/Oropharynx  Lips: normal;  Teeth: normal;  Gums: gingiva normal;  Tongue: normal;  Oral mucosa: normal;  Hard palate: normal;  Neck  Neck: neck normal; neck palpation normal;  Thyroid: thyroid  normal;  Respiratory  Inspection: breathing unlabored; normal breathing rate;  Lymphatic  Palpation: lymph nodes normal;  Neurovestibular  Mental Status: alert and oriented;  Psychiatric: mood normal; affect is appropriate;  Cranial Nerves: cranial nerves intact;       Assessment:  ENCOUNTER DIAGNOSES     ICD-10-CM   1. Bilateral hearing loss, unspecified hearing loss type  H91.93   2. Pain of submandibular gland  K11.8       Plan:  Medical records reviewed on 07/19/2023.  Discussed audiogram and she would like to wait on having this done.  Will continue hearing aids from [facility]  Submandibular pain resolved, will follow up if symptoms return      No orders of the defined types were placed in this encounter.    Return if symptoms worsen or fail to improve.    Junette Older, FNP-BC  07/19/2023, 16:09

## 2024-01-07 ENCOUNTER — Encounter (INDEPENDENT_AMBULATORY_CARE_PROVIDER_SITE_OTHER): Payer: Self-pay
